# Patient Record
Sex: Male | Born: 1990
Health system: Southern US, Community
[De-identification: ages and names within clinical notes are randomized; demographics above are authoritative.]

## PROBLEM LIST (undated history)

## (undated) DIAGNOSIS — E079 Disorder of thyroid, unspecified: Secondary | ICD-10-CM

## (undated) DIAGNOSIS — Z8619 Personal history of other infectious and parasitic diseases: Secondary | ICD-10-CM

## (undated) HISTORY — DX: Disorder of thyroid, unspecified: E07.9

## (undated) HISTORY — DX: Personal history of other infectious and parasitic diseases: Z86.19

---
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
## 2011-03-08 ENCOUNTER — Encounter: Payer: Self-pay | Admitting: Cardiology

## 2011-03-23 ENCOUNTER — Encounter: Payer: Self-pay | Admitting: Cardiovascular Disease

## 2011-03-24 ENCOUNTER — Encounter: Payer: Self-pay | Admitting: Cardiovascular Disease

## 2011-03-24 ENCOUNTER — Ambulatory Visit (INDEPENDENT_AMBULATORY_CARE_PROVIDER_SITE_OTHER): Payer: PRIVATE HEALTH INSURANCE | Admitting: Cardiovascular Disease

## 2011-03-24 DIAGNOSIS — R079 Chest pain, unspecified: Secondary | ICD-10-CM

## 2011-03-24 DIAGNOSIS — E7211 Homocystinuria: Secondary | ICD-10-CM

## 2011-03-24 DIAGNOSIS — E069 Thyroiditis, unspecified: Secondary | ICD-10-CM

## 2011-03-24 DIAGNOSIS — E7210 Disorders of sulfur-bearing amino-acid metabolism, unspecified: Secondary | ICD-10-CM

## 2011-03-24 DIAGNOSIS — R002 Palpitations: Secondary | ICD-10-CM

## 2011-03-24 NOTE — Assessment & Plan Note (Signed)
Atypical  F/U stress echo   

## 2011-03-24 NOTE — Patient Instructions (Addendum)
Your physician has requested that you have a stress echocardiogram. For further information please visit [email or web address]. Please follow instruction sheet as given.   

## 2011-03-24 NOTE — Assessment & Plan Note (Signed)
Benign no indication for monitor at this time  Normal exam and QT

## 2011-03-24 NOTE — Progress Notes (Signed)
20 yo referred by [facility].  Atypical chest pain and palpitations.  Occurs at rest or with activity.  Some anxiety component.  Sharp pains in chest and shoulders.  Plays lots of softball with no SSCP, dyspnea syncope or palpitations.  Concern about homocystine being high but only one point above ULN and LDL and CRP normal.  Has autoimmune thryroid disease with no other glandular involement.  TSH ok.  No history of previous cardiac problem or murmur.  2 brothers and 1/2 sister ok.  Denies ETOH, drugs or cigaretts.  Symptoms of palpitations and atypical pain persistant but not worsening  ROS: Denies fever, malais, weight loss, blurry vision, decreased visual acuity, cough, sputum, SOB, hemoptysis, pleuritic pain, palpitaitons, heartburn, abdominal pain, melena, lower extremity edema, claudication, or rash.  All other systems reviewed and negative  General: Affect appropriate Healthy:  appears stated age HEENT: normal Neck supple with no adenopathy JVP normal no bruits no thyromegaly Lungs clear with no wheezing and good diaphragmatic motion Heart:  S1/S2 no murmur,rub, gallop or click PMI normal Abdomen: benighn, BS positve, no tenderness, no AAA no bruit.  No HSM or HJR Distal pulses intact with no bruits No edema Neuro non-focal Skin warm and dry No muscular weakness   Current Outpatient Prescriptions  Medication Sig Dispense Refill  . Folic Acid-Vit B6-Vit B12 (FOLBEE) 2.5-25-1 MG TABS Take 1 tablet by mouth daily.        . naltrexone (DEPADE) 50 MG tablet Take 50 mg by mouth daily.        . Nutritional Supplements (DHEA PO) Take 1 tablet by mouth daily.        Marland Kitchen thyroid (ARMOUR) 120 MG tablet Take 120 mg by mouth daily.        . Vitamin D, Ergocalciferol, (DRISDOL) 50000 UNITS CAPS Take 50,000 Units by mouth daily.          Allergies  Review of patient's allergies indicates no known allergies.  Electrocardiogram:  NSR sinus arrhythmia rate 76 Minimal voltage LVH  Nonspecific T wave changes in 3,f  QT 344  Lab Review:  TSH 1.8  Homocystine 16.5 (UN 15) , normal sex hormones, CRP 3 , LDL 76 Free T4 .79  Assessment and Plan

## 2011-03-24 NOTE — Assessment & Plan Note (Signed)
Vit B as needed but other indices like CRP and LDL are fine

## 2011-03-24 NOTE — Assessment & Plan Note (Signed)
F/U primary continue naltrexone.  Thyroid disease may predispose him to sinus arrhythmia and variable HR

## 2011-03-29 ENCOUNTER — Ambulatory Visit (HOSPITAL_COMMUNITY): Payer: PRIVATE HEALTH INSURANCE | Attending: Cardiovascular Disease | Admitting: Radiology

## 2011-03-29 ENCOUNTER — Ambulatory Visit (HOSPITAL_BASED_OUTPATIENT_CLINIC_OR_DEPARTMENT_OTHER): Payer: PRIVATE HEALTH INSURANCE

## 2011-03-29 DIAGNOSIS — R079 Chest pain, unspecified: Secondary | ICD-10-CM | POA: Insufficient documentation

## 2011-03-29 DIAGNOSIS — R002 Palpitations: Secondary | ICD-10-CM | POA: Insufficient documentation

## 2011-03-29 DIAGNOSIS — R0989 Other specified symptoms and signs involving the circulatory and respiratory systems: Secondary | ICD-10-CM

## 2011-03-29 DIAGNOSIS — R072 Precordial pain: Secondary | ICD-10-CM

## 2011-04-06 ENCOUNTER — Encounter: Payer: Self-pay | Admitting: *Deleted

## 2011-04-14 ENCOUNTER — Emergency Department (HOSPITAL_COMMUNITY): Payer: PRIVATE HEALTH INSURANCE

## 2011-04-14 ENCOUNTER — Encounter (HOSPITAL_COMMUNITY): Payer: Self-pay | Admitting: *Deleted

## 2011-04-14 ENCOUNTER — Emergency Department (HOSPITAL_COMMUNITY)
Admission: EM | Admit: 2011-04-14 | Discharge: 2011-04-15 | Disposition: A | Discharge status: Home / Self Care | Payer: PRIVATE HEALTH INSURANCE | Attending: Emergency Medicine | Admitting: Emergency Medicine

## 2011-04-14 DIAGNOSIS — W19XXXA Unspecified fall, initial encounter: Secondary | ICD-10-CM | POA: Insufficient documentation

## 2011-04-14 DIAGNOSIS — Y9364 Activity, baseball: Secondary | ICD-10-CM | POA: Insufficient documentation

## 2011-04-14 DIAGNOSIS — S62109A Fracture of unspecified carpal bone, unspecified wrist, initial encounter for closed fracture: Secondary | ICD-10-CM | POA: Insufficient documentation

## 2011-04-14 DIAGNOSIS — M25539 Pain in unspecified wrist: Secondary | ICD-10-CM | POA: Insufficient documentation

## 2011-04-14 MED ORDER — HYDROCODONE-ACETAMINOPHEN 5-325 MG PO TABS
1.0000 | ORAL_TABLET | Freq: Once | ORAL | Status: AC
  Administered 2011-04-15: 1 via ORAL
  Filled 2011-04-14: qty 1

## 2011-04-14 NOTE — ED Notes (Signed)
Pt reports he was playing softball and slid landing on rt wrist, PMS intact

## 2011-04-14 NOTE — ED Notes (Signed)
Pt states slipped while running & tried to break his fall. Swelling noted.

## 2011-04-15 ENCOUNTER — Encounter (HOSPITAL_COMMUNITY): Payer: Self-pay | Admitting: Emergency Medicine

## 2011-04-15 MED ORDER — HYDROCODONE-ACETAMINOPHEN 5-325 MG PO TABS: ORAL_TABLET | ORAL | Status: AC

## 2011-04-15 NOTE — ED Provider Notes (Signed)
History     CSN: [id number] Arrival date & time: 04/14/2011 11:09 PM  Chief Complaint  Patient presents with  . Wrist Pain    (Consider location/radiation/quality/duration/timing/severity/associated sxs/prior treatment) HPI Comments: Patient c/o pain and swelling to the right wrist after he fell on his wrist while playing softball.  He denies numbness, weakness or other injuries.  Patient is right hand dominant  Patient is a 20 y.o. male presenting with wrist pain. The history is provided by the patient.  Wrist Pain This is a new problem. The current episode started today. The problem occurs constantly. The problem has been unchanged. Associated symptoms include arthralgias and joint swelling. Pertinent negatives include no abdominal pain, chest pain, chills, coughing, fatigue, fever, myalgias, neck pain, numbness, rash, sore throat, vomiting or weakness. Exacerbated by: movement, palpation. He has tried nothing for the symptoms. The treatment provided no relief.    Past Medical History  Diagnosis Date  . Thyroid disease     History reviewed. No pertinent past surgical history.  History reviewed. No pertinent family history.  History  Substance Use Topics  . Smoking status: Never Smoker   . Smokeless tobacco: Not on file  . Alcohol Use: No      Review of Systems  Constitutional: Negative for fever, chills and fatigue.  HENT: Negative for sore throat, trouble swallowing, neck pain and neck stiffness.   Respiratory: Negative for cough, shortness of breath and wheezing.   Cardiovascular: Negative for chest pain and palpitations.  Gastrointestinal: Negative for vomiting and abdominal pain.  Genitourinary: Negative for dysuria, hematuria and flank pain.  Musculoskeletal: Positive for joint swelling and arthralgias. Negative for myalgias and back pain.  Skin: Negative for rash.  Neurological: Negative for dizziness, weakness and numbness.  Hematological: Does not bruise/bleed  easily.  All other systems reviewed and are negative.    Allergies  Review of patient's allergies indicates no known allergies.  Home Medications   Current Outpatient Rx  Name Route Sig Dispense Refill  . FOLIC ACID-VIT B6-VIT B12 2.5-25-1 MG PO TABS Oral Take 1 tablet by mouth daily.      Marland Kitchen NALTREXONE HCL 50 MG PO TABS Oral Take 50 mg by mouth daily.      Marland Kitchen DHEA PO Oral Take 1 tablet by mouth daily.      . THYROID 120 MG PO TABS Oral Take 120 mg by mouth daily.      Marland Kitchen VITAMIN D (ERGOCALCIFEROL) 50000 UNITS PO CAPS Oral Take 50,000 Units by mouth daily.        BP 114/72  Pulse 77  Temp(Src) 98.7 F (37.1 C) (Oral)  Resp 17  SpO2 100%  Physical Exam  Nursing note and vitals reviewed. Constitutional: He is oriented to person, place, and time. He appears well-developed and well-nourished. No distress.  HENT:  Head: Normocephalic and atraumatic.  Mouth/Throat: Oropharynx is clear and moist.  Neck: Normal range of motion. Neck supple.  Cardiovascular: Normal rate, regular rhythm and normal heart sounds.   Pulmonary/Chest: Effort normal and breath sounds normal. No respiratory distress. He exhibits no tenderness.  Abdominal: Soft. He exhibits no distension. There is no tenderness.  Musculoskeletal: He exhibits edema and tenderness.       Right wrist: He exhibits decreased range of motion, tenderness, bony tenderness and swelling. He exhibits no effusion, no crepitus and no laceration.       ttp of the medial wrist and anatomical snuff box.  Moderate STS.  Radial pulse is strong, CR<2 sec,  sensation intact  Lymphadenopathy:    He has no cervical adenopathy.  Neurological: He is alert and oriented to person, place, and time. No cranial nerve deficit. He exhibits normal muscle tone. Coordination normal.  Skin: Skin is warm and dry.    ED Course  ORTHOPEDIC INJURY TREATMENT Date/Time: 04/15/2011 12:24 AM Performed by: Trisha Mangle, Fannie Gathright L. Authorized by: Annamarie Dawley Consent:  Verbal consent obtained. Written consent not obtained. Risks and benefits: risks, benefits and alternatives were discussed Consent given by: patient Patient understanding: patient states understanding of the procedure being performed Patient consent: the patient's understanding of the procedure matches consent given Procedure consent: procedure consent matches procedure scheduled Imaging studies: imaging studies available Patient identity confirmed: verbally with patient Time out: Immediately prior to procedure a "time out" was called to verify the correct patient, procedure, equipment, support staff and site/side marked as required. Injury location: wrist Location details: right wrist Injury type: fracture Fracture type: ulnar styloid and scaphoid Pre-procedure neurovascular assessment: neurovascularly intact Pre-procedure distal perfusion: normal Pre-procedure neurological function: normal Pre-procedure range of motion: reduced Local anesthesia used: no Patient sedated: no Manipulation performed: no Immobilization: splint and sling Splint type: sugar tong Post-procedure neurovascular assessment: post-procedure neurovascularly intact Post-procedure distal perfusion: normal Post-procedure neurological function: normal Post-procedure range of motion: unchanged Patient tolerance: Patient tolerated the procedure well with no immediate complications.   (including critical care time)  Dg Forearm Right  04/14/2011  *RADIOLOGY REPORT*  Clinical Data: Injured right wrist in a softball game, right lateral wrist pain  RIGHT FOREARM - 2 VIEW  Comparison: Right wrist radiographs - earlier same day  Findings: The previously identified minimally displaced fracture of the ulnar styloid process is less conspicuous on the present examination.  No new fractures identified.  Limited visualization of the elbow is normal.  Regional soft tissues are normal.  IMPRESSION: Previously described minimally  displaced fracture of the ulnar styloid process is less conspicuous on this examination.  Please refer to right wrist radiograph dictation performed earlier same day.  Original Report Authenticated By: Waynard Reeds, M.D.   Dg Wrist Complete Right  04/14/2011  *RADIOLOGY REPORT*  Clinical Data: Injured right wrist during softball game; right lateral wrist pain  RIGHT WRIST - COMPLETE 3+ VIEW  Comparison: None.  Findings: There is a minimally displaced fracture of the ulnar styloid process.  Incidental note is made of ulnar minus variance. There is no definitive displacement of the pronator quadratus fat pad.  Radiocarpal and intercarpal articulations are preserved. Regional soft tissues are normal.  IMPRESSION: 1.  Minimally displaced fracture of the ulnar styloid process. 2.  If the patient has pain referable to the anatomic snuff box, immobilization and repeat radiographs in 10-14 days is recommended to exclude radiographically occult scaphoid fracture.  Original Report Authenticated By: Waynard Reeds, M.D.        MDM     patient agrees to follow-up with Dr. Romeo Apple.  Will immobilize his wrist with sugar tong splint.      Reesa Gotschall L. [place], [place] 04/15/11 [phone]

## 2011-04-17 NOTE — ED Provider Notes (Signed)
Medical screening examination/treatment/procedure(s) were performed by non-physician practitioner and as supervising physician I was immediately available for consultation/collaboration.  Nicoletta Dress. Colon Branch, MD 04/17/11 2104

## 2011-11-30 IMAGING — CR DG FOREARM 2V*R*
2 series · 2 of 2 positions shown · non-contrast
Comparison: Right wrist radiographs - earlier same day

CLINICAL DATA: Injured right wrist in a softball game, right
lateral wrist pain

RIGHT FOREARM - 2 VIEW

[view not recorded (1 of 2)]
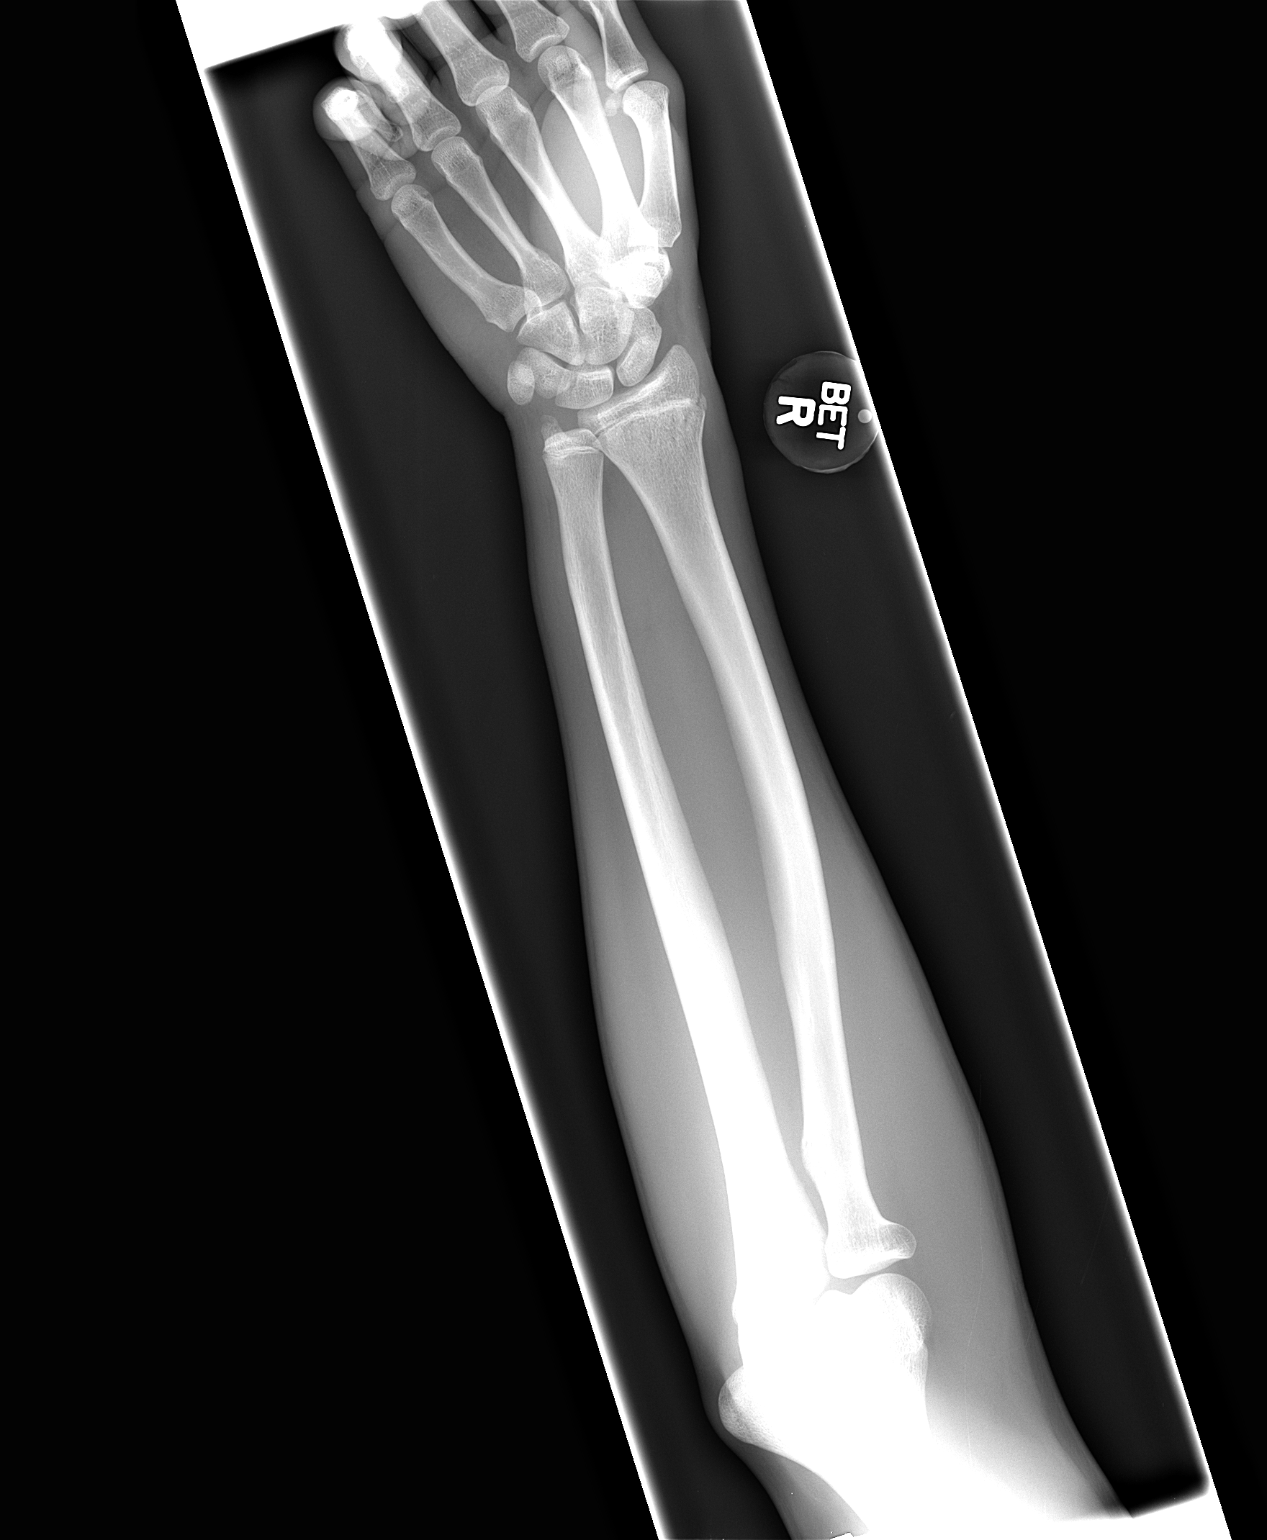

[view not recorded (2 of 2)]
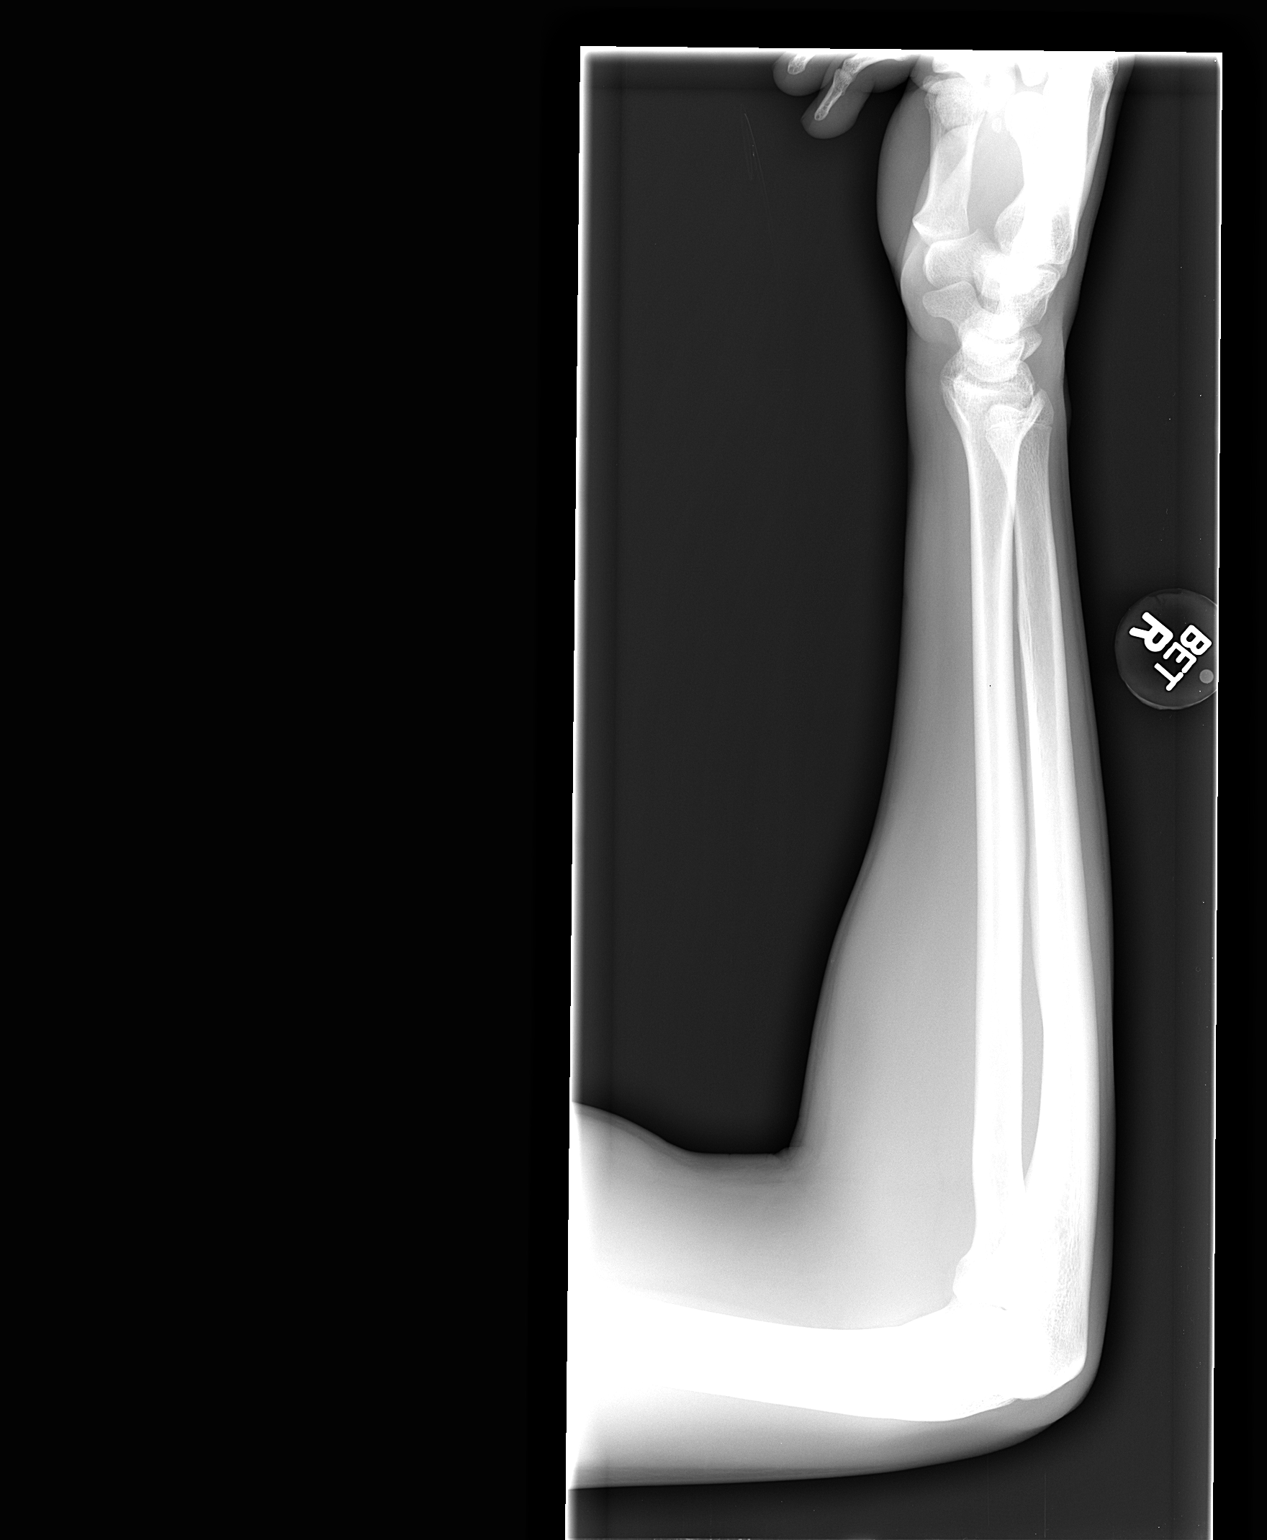

[2 of 2 positions shown; findings below may reference images not displayed]

FINDINGS: The previously identified minimally displaced fracture of
the ulnar styloid process is less conspicuous on the present
examination.  No new fractures identified.  Limited visualization
of the elbow is normal.  Regional soft tissues are normal.
IMPRESSION: Previously described minimally displaced fracture of the ulnar
styloid process is less conspicuous on this examination.  Please
refer to right wrist radiograph dictation performed earlier same
day.

## 2014-03-03 ENCOUNTER — Other Ambulatory Visit: Payer: Self-pay | Admitting: Physician Assistant

## 2014-06-23 ENCOUNTER — Other Ambulatory Visit: Payer: Self-pay | Admitting: Physician Assistant

## 2018-05-30 DIAGNOSIS — E039 Hypothyroidism, unspecified: Secondary | ICD-10-CM | POA: Diagnosis not present

## 2018-05-30 DIAGNOSIS — R002 Palpitations: Secondary | ICD-10-CM | POA: Diagnosis not present

## 2018-08-06 ENCOUNTER — Ambulatory Visit: Payer: PRIVATE HEALTH INSURANCE | Admitting: Neurology

## 2018-08-15 DIAGNOSIS — J310 Chronic rhinitis: Secondary | ICD-10-CM | POA: Diagnosis not present

## 2018-08-15 DIAGNOSIS — R490 Dysphonia: Secondary | ICD-10-CM | POA: Diagnosis not present

## 2019-02-07 ENCOUNTER — Other Ambulatory Visit: Payer: Self-pay

## 2019-02-07 ENCOUNTER — Other Ambulatory Visit: Payer: PRIVATE HEALTH INSURANCE

## 2019-02-07 DIAGNOSIS — Z20822 Contact with and (suspected) exposure to covid-19: Secondary | ICD-10-CM

## 2019-02-10 LAB — NOVEL CORONAVIRUS, NAA: SARS-CoV-2, NAA: NOT DETECTED

## 2019-03-11 DIAGNOSIS — R079 Chest pain, unspecified: Secondary | ICD-10-CM | POA: Diagnosis not present

## 2019-04-18 DIAGNOSIS — E039 Hypothyroidism, unspecified: Secondary | ICD-10-CM | POA: Diagnosis not present

## 2019-05-07 DIAGNOSIS — R042 Hemoptysis: Secondary | ICD-10-CM | POA: Diagnosis not present

## 2019-05-07 DIAGNOSIS — F419 Anxiety disorder, unspecified: Secondary | ICD-10-CM | POA: Diagnosis not present

## 2019-05-07 DIAGNOSIS — R002 Palpitations: Secondary | ICD-10-CM | POA: Diagnosis not present

## 2019-05-31 ENCOUNTER — Other Ambulatory Visit: Payer: Self-pay

## 2019-05-31 DIAGNOSIS — Z20822 Contact with and (suspected) exposure to covid-19: Secondary | ICD-10-CM

## 2019-06-03 LAB — NOVEL CORONAVIRUS, NAA: SARS-CoV-2, NAA: DETECTED — AB

## 2019-08-28 DIAGNOSIS — E039 Hypothyroidism, unspecified: Secondary | ICD-10-CM | POA: Diagnosis not present

## 2019-08-29 LAB — TSH: TSH: 32.8 — AB (ref 0.41–5.90)

## 2019-09-06 DIAGNOSIS — M545 Low back pain: Secondary | ICD-10-CM | POA: Diagnosis not present

## 2019-09-06 DIAGNOSIS — M542 Cervicalgia: Secondary | ICD-10-CM | POA: Diagnosis not present

## 2019-09-06 DIAGNOSIS — M9903 Segmental and somatic dysfunction of lumbar region: Secondary | ICD-10-CM | POA: Diagnosis not present

## 2019-09-06 DIAGNOSIS — M9901 Segmental and somatic dysfunction of cervical region: Secondary | ICD-10-CM | POA: Diagnosis not present

## 2019-09-10 DIAGNOSIS — Z6821 Body mass index (BMI) 21.0-21.9, adult: Secondary | ICD-10-CM | POA: Diagnosis not present

## 2019-09-10 DIAGNOSIS — E039 Hypothyroidism, unspecified: Secondary | ICD-10-CM | POA: Diagnosis not present

## 2019-10-04 ENCOUNTER — Other Ambulatory Visit: Payer: Self-pay

## 2019-10-04 ENCOUNTER — Encounter: Payer: Self-pay | Admitting: "Endocrinology

## 2019-10-04 ENCOUNTER — Ambulatory Visit: Payer: BC Managed Care – PPO | Admitting: "Endocrinology

## 2019-10-04 VITALS — BP 109/75 | HR 71 | Ht 71.0 in | Wt 152.4 lb

## 2019-10-04 DIAGNOSIS — E039 Hypothyroidism, unspecified: Secondary | ICD-10-CM

## 2019-10-04 MED ORDER — SYNTHROID 112 MCG PO TABS: 112.0000 ug | ORAL_TABLET | Freq: Every day | ORAL | 2 refills | Status: DC

## 2019-10-04 NOTE — Progress Notes (Signed)
Endocrinology Consult Note                                         10/04/2019, 1:21 PM   Cameron Cooper is a 29 y.o.-year-old male patient being seen in consultation for hypothyroidism referred by Rosalee Kaufman, PA-C.   Past Medical History:  Diagnosis Date  . Thyroid disease     History reviewed. No pertinent surgical history.  Social History   Socioeconomic History  . Marital status: Married    Spouse name: Not on file  . Number of children: Not on file  . Years of education: Not on file  . Highest education level: Not on file  Occupational History  . Not on file  Tobacco Use  . Smoking status: Never Smoker  Substance and Sexual Activity  . Alcohol use: No  . Drug use: Not on file  . Sexual activity: Not on file  Other Topics Concern  . Not on file  Social History Narrative  . Not on file   Social Determinants of Health   Financial Resource Strain:   . Difficulty of Paying Living Expenses:   Food Insecurity:   . Worried About Charity fundraiser in the Last Year:   . Arboriculturist in the Last Year:   Transportation Needs:   . Film/video editor (Medical):   Marland Kitchen Lack of Transportation (Non-Medical):   Physical Activity:   . Days of Exercise per Week:   . Minutes of Exercise per Session:   Stress:   . Feeling of Stress :   Social Connections:   . Frequency of Communication with Friends and Family:   . Frequency of Social Gatherings with Friends and Family:   . Attends Religious Services:   . Active Member of Clubs or Organizations:   . Attends Archivist Meetings:   Marland Kitchen Marital Status:     Family History  Problem Relation Age of Onset  . Thyroid disease Mother     Outpatient Encounter Medications as of 10/04/2019  Medication Sig  . NALTREXONE HCL PO Take 4.5 mg by mouth at bedtime.  . Nutritional Supplements (DHEA PO) Take 1 tablet by mouth daily.    Marland Kitchen  SYNTHROID 112 MCG tablet Take 1 tablet (112 mcg total) by mouth daily before breakfast.  . [DISCONTINUED] Folic Acid-Vit M4-QAS T41 (FOLBEE) 2.5-25-1 MG TABS Take 1 tablet by mouth daily.    . [DISCONTINUED] naltrexone (DEPADE) 50 MG tablet Take 4.5 mg by mouth daily.   . [DISCONTINUED] SYNTHROID 100 MCG tablet Take 100 mcg by mouth every morning.  . [DISCONTINUED] thyroid (ARMOUR) 120 MG tablet Take 120 mg by mouth daily.    . [DISCONTINUED] Vitamin D, Ergocalciferol, (DRISDOL) 50000 UNITS CAPS Take 50,000 Units by mouth daily.     No facility-administered encounter medications on file as of 10/04/2019.    ALLERGIES: No Known Allergies VACCINATION STATUS:  There is no immunization  history on file for this patient.   HPI    Cameron Cooper  is a patient with the above medical history. he was diagnosed  with hypothyroidism at approximate age of 16 years  which required initiation of thyroid hormone supplements.  - he was given various doses of thyroxine/Armour thyroid over the years, currently on 100 mcg of Synthroid. he reports compliance to this medication: His most recent thyroid function test in February 2021 was significant for high TSH of 32. He felt fatigue, cold intolerance.   Lab Results  Component Value Date   TSH 32.80 (A) 08/29/2019  He has progressively gained 12 pound x2 years.  Pt denies feeling nodules in neck, hoarseness, dysphagia/odynophagia, SOB with lying down.  he has family history of  thyroid disorders in his mother and grandparents.  No family history of thyroid cancer.  No history of  radiation therapy to head or neck. No recent use of iodine supplements.  ROS:  Constitutional: + weight gain, + fatigue, no subjective hyperthermia, no subjective hypothermia Eyes: no blurry vision, no xerophthalmia ENT: no sore throat, no nodules palpated in throat, no dysphagia/odynophagia, no hoarseness Cardiovascular: no Chest Pain, no Shortness of Breath, no  palpitations, no leg swelling Respiratory: no cough, no SOB Gastrointestinal: no Nausea/Vomiting/Diarhhea Musculoskeletal: no muscle/joint aches Skin: no rashes Neurological: no tremors, no numbness, no tingling, no dizziness Psychiatric: no depression, no anxiety   Physical Exam: BP 109/75   Pulse 71   Ht 5\' 11"  (1.803 m)   Wt 152 lb 6.4 oz (69.1 kg)   BMI 21.26 kg/m  Wt Readings from Last 3 Encounters:  10/04/19 152 lb 6.4 oz (69.1 kg)  03/24/11 140 lb (63.5 kg) (28 %, Z= -0.60)*   * Growth percentiles are based on CDC (Boys, 2-20 Years) data.    Constitutional:  Body mass index is 21.26 kg/m., not in acute distress, normal state of mind Eyes: PERRLA, EOMI, no exophthalmos ENT: moist mucous membranes, no thyromegaly, no cervical lymphadenopathy Cardiovascular: normal precordial activity, Regular Rate and Rhythm, no Murmur/Rubs/Gallops Respiratory:  adequate breathing efforts, no gross chest deformity, Clear to auscultation bilaterally Gastrointestinal: abdomen soft, Non -tender, No distension, Bowel Sounds present Musculoskeletal: no gross deformities, strength intact in all four extremities Skin: moist, warm, no rashes Neurological: no tremor with outstretched hands, Deep tendon reflexes normal in all four extremities.    Lab Results  Component Value Date   TSH 32.80 (A) 08/29/2019       ASSESSMENT: 1. Hypothyroidism  PLAN:    Patient with long-standing hypothyroidism, on levothyroxine therapy. On physical exam , patient  does not  have  gross goiter, thyroid nodules, or neck compression symptoms.  He will benefit from slight increase in his Synthroid dose.  I discussed and increased it to 112 mcg p.o. daily before breakfast. - We discussed about correct intake of levothyroxine, at fasting, with water, separated by at least 30 minutes from breakfast, and separated by more than 4 hours from calcium, iron, multivitamins, acid reflux medications (PPIs). -Patient is  made aware of the fact that thyroid hormone replacement is needed for life, dose to be adjusted by periodic monitoring of thyroid function tests. - Will check thyroid tests before next visit: TSH, free T4 -Due to absence of clinical goiter, no need for thyroid ultrasound.  - Time spent with the patient: 45 minutes, of which >50% was spent in obtaining information about his symptoms, reviewing his previous labs, evaluations, and treatments, counseling him about his primary  hypothyroidism, and developing a plan to confirm the diagnosis and long term treatment as necessary. Please refer to " Patient Self Inventory" in the Media  tab for reviewed elements of pertinent patient history.  Jesson L Belzer participated in the discussions, expressed understanding, and voiced agreement with the above plans.  All questions were answered to his satisfaction. he is encouraged to contact clinic should he have any questions or concerns prior to his return visit.  Return in about 9 weeks (around 12/06/2019) for Follow up with Pre-visit Labs.  Marquis Lunch, MD [facility] [address] [place], [place] [address] Phone: [phone]  Fax: [phone]   10/04/2019, 1:21 PM  This note was partially dictated with voice recognition software. Similar sounding words can be transcribed inadequately or may not  be corrected upon review.

## 2019-11-25 DIAGNOSIS — E063 Autoimmune thyroiditis: Secondary | ICD-10-CM | POA: Diagnosis not present

## 2019-11-25 DIAGNOSIS — E039 Hypothyroidism, unspecified: Secondary | ICD-10-CM | POA: Diagnosis not present

## 2019-11-25 DIAGNOSIS — Z0189 Encounter for other specified special examinations: Secondary | ICD-10-CM | POA: Diagnosis not present

## 2019-12-04 ENCOUNTER — Ambulatory Visit: Payer: BC Managed Care – PPO | Admitting: "Endocrinology

## 2019-12-10 ENCOUNTER — Other Ambulatory Visit: Payer: Self-pay | Admitting: "Endocrinology

## 2019-12-20 DIAGNOSIS — Z0189 Encounter for other specified special examinations: Secondary | ICD-10-CM | POA: Diagnosis not present

## 2019-12-20 DIAGNOSIS — E039 Hypothyroidism, unspecified: Secondary | ICD-10-CM | POA: Diagnosis not present

## 2019-12-20 LAB — LIPID PANEL
Cholesterol: 144 (ref 0–200)
HDL: 48 (ref 35–70)
LDL Cholesterol: 83
Triglycerides: 63 (ref 40–160)

## 2019-12-20 LAB — BASIC METABOLIC PANEL
BUN: 10 (ref 4–21)
Creatinine: 0.8 (ref 0.6–1.3)

## 2019-12-20 LAB — TSH: TSH: 0.62 (ref 0.41–5.90)

## 2019-12-27 DIAGNOSIS — Z0001 Encounter for general adult medical examination with abnormal findings: Secondary | ICD-10-CM | POA: Diagnosis not present

## 2019-12-30 ENCOUNTER — Encounter: Payer: Self-pay | Admitting: "Endocrinology

## 2019-12-30 ENCOUNTER — Other Ambulatory Visit: Payer: Self-pay

## 2019-12-30 ENCOUNTER — Ambulatory Visit (INDEPENDENT_AMBULATORY_CARE_PROVIDER_SITE_OTHER): Payer: BC Managed Care – PPO | Admitting: "Endocrinology

## 2019-12-30 VITALS — BP 111/65 | HR 63 | Ht 71.0 in | Wt 151.9 lb

## 2019-12-30 DIAGNOSIS — E039 Hypothyroidism, unspecified: Secondary | ICD-10-CM

## 2019-12-30 MED ORDER — LEVOTHYROXINE SODIUM 112 MCG PO TABS: ORAL_TABLET | ORAL | 2 refills | Status: DC

## 2019-12-30 NOTE — Progress Notes (Signed)
12/30/2019, 6:45 PM  Endocrinology follow-up note   Cameron Cooper is a 29 y.o.-year-old male patient being seen in follow-up after he was seen in consultation for hypothyroidism referred by Royann Shivers, PA-C.   Past Medical History:  Diagnosis Date  . Thyroid disease     History reviewed. No pertinent surgical history.  Social History   Socioeconomic History  . Marital status: Married    Spouse name: Not on file  . Number of children: Not on file  . Years of education: Not on file  . Highest education level: Not on file  Occupational History  . Not on file  Tobacco Use  . Smoking status: Never Smoker  Substance and Sexual Activity  . Alcohol use: No  . Drug use: Not on file  . Sexual activity: Not on file  Other Topics Concern  . Not on file  Social History Narrative  . Not on file   Social Determinants of Health   Financial Resource Strain:   . Difficulty of Paying Living Expenses:   Food Insecurity:   . Worried About Programme researcher, broadcasting/film/video in the Last Year:   . Barista in the Last Year:   Transportation Needs:   . Freight forwarder (Medical):   Marland Kitchen Lack of Transportation (Non-Medical):   Physical Activity:   . Days of Exercise per Week:   . Minutes of Exercise per Session:   Stress:   . Feeling of Stress :   Social Connections:   . Frequency of Communication with Friends and Family:   . Frequency of Social Gatherings with Friends and Family:   . Attends Religious Services:   . Active Member of Clubs or Organizations:   . Attends Banker Meetings:   Marland Kitchen Marital Status:     Family History  Problem Relation Age of Onset  . Thyroid disease Mother     Outpatient Encounter Medications as of 12/30/2019  Medication Sig  . levothyroxine (SYNTHROID) 112 MCG tablet TAKE (1) TABLET BY MOUTH EACH MORNING.  Marland Kitchen NALTREXONE HCL PO Take 4.5 mg by mouth at bedtime.   . Nutritional Supplements (DHEA PO) Take 1 tablet by mouth daily.    . [DISCONTINUED] levothyroxine (SYNTHROID) 112 MCG tablet TAKE (1) TABLET BY MOUTH EACH MORNING.   No facility-administered encounter medications on file as of 12/30/2019.    ALLERGIES: No Known Allergies VACCINATION STATUS:  There is no immunization history on file for this patient.   HPI    Cameron Cooper  is a patient with the above medical history. he was diagnosed  with hypothyroidism at approximate age of 16 years  which required initiation of thyroid hormone supplements.  - he was given various doses of thyroxine/Armour thyroid over the years, currently on of Synthroid. he reports compliance to this medication: His most recent thyroid function tests were consistent with appropriate replacement.   He has a steady weight, patient  denies feeling nodules in neck, hoarseness, dysphagia/odynophagia, SOB with lying down.  he has family history of  thyroid  disorders in his mother and grandparents.  No family history of thyroid cancer.  No history of  radiation therapy to head or neck. No recent use of iodine supplements.  ROS:  Constitutional: + Steady weight since last visit,  no subjective hyperthermia, no subjective hypothermia Eyes: no blurry vision, no xerophthalmia ENT: no sore throat, no nodules palpated in throat, no dysphagia/odynophagia, no hoarseness Cardiovascular: no Chest Pain, no Shortness of Breath, no palpitations, no leg swelling Respiratory: no cough, no SOB Gastrointestinal: no Nausea/Vomiting/Diarhhea Musculoskeletal: no muscle/joint aches Skin: no rashes Neurological: no tremors, no numbness, no tingling, no dizziness Psychiatric: no depression, no anxiety   Physical Exam: BP 111/65   Pulse 63   Ht 5\' 11"  (1.803 m)   Wt 151 lb 14.4 oz (68.9 kg)   BMI 21.19 kg/m  Wt Readings from Last 3 Encounters:  12/30/19 151 lb 14.4 oz (68.9 kg)  10/04/19 152 lb 6.4 oz (69.1 kg)   03/24/11 140 lb (63.5 kg) (28 %, Z= -0.60)*   * Growth percentiles are based on CDC (Boys, 2-20 Years) data.    Recent Results (from the past 2160 hour(s))  Basic metabolic panel     Status: None   Collection Time: 12/20/19 12:00 AM  Result Value Ref Range   BUN 10 4 - 21   Creatinine 0.8 0.6 - 1.3  Lipid panel     Status: None   Collection Time: 12/20/19 12:00 AM  Result Value Ref Range   Triglycerides 63 40 - 160   Cholesterol 144 0 - 200   HDL 48 35 - 70   LDL Cholesterol 83   TSH     Status: None   Collection Time: 12/20/19 12:00 AM  Result Value Ref Range   TSH 0.62 0.41 - 5.90    Comment: free t4 1.5     ASSESSMENT: 1. Hypothyroidism  PLAN:  -His previsit thyroid function tests are consistent with appropriate replacement.  -He is advised to continue levothyroxine 112 mcg p.o. daily before breakfast.   - We discussed about the correct intake of his thyroid hormone, on empty stomach at fasting, with water, separated by at least 30 minutes from breakfast and other medications,  and separated by more than 4 hours from calcium, iron, multivitamins, acid reflux medications (PPIs). -Patient is made aware of the fact that thyroid hormone replacement is needed for life, dose to be adjusted by periodic monitoring of thyroid function tests. He does not have clinical goiter, hence no need for thyroid imaging at this time.  His next labs will include antithyroid antibodies to establish etiology of his hypothyroidism.  He is advised to maintain close follow-up with his PCP Clemmie Krill, PA-C.     - Time spent on this patient care encounter:  20 minutes of which 50% was spent in  counseling and the rest reviewing  his current and  previous labs / studies and medications  doses and developing a plan for long term care. Cordie L Strupp  participated in the discussions, expressed understanding, and voiced agreement with the above plans.  All questions were answered to his  satisfaction. he is encouraged to contact clinic should he have any questions or concerns prior to his return visit.   Return in about 5 months (around 05/31/2020) for F/U with Pre-visit Labs.  Glade Lloyd, MD [facility] [address] [place], [place] [address] Phone: [phone]  Fax: [phone]   12/30/2019, 6:45 PM  This note  was partially dictated with voice recognition software. Similar sounding words can be transcribed inadequately or may not  be corrected upon review.

## 2020-03-05 DIAGNOSIS — F419 Anxiety disorder, unspecified: Secondary | ICD-10-CM | POA: Diagnosis not present

## 2020-03-05 DIAGNOSIS — R42 Dizziness and giddiness: Secondary | ICD-10-CM | POA: Diagnosis not present

## 2020-03-05 DIAGNOSIS — R002 Palpitations: Secondary | ICD-10-CM | POA: Diagnosis not present

## 2020-03-09 ENCOUNTER — Other Ambulatory Visit: Payer: Self-pay | Admitting: "Endocrinology

## 2020-03-19 DIAGNOSIS — E063 Autoimmune thyroiditis: Secondary | ICD-10-CM | POA: Diagnosis not present

## 2020-03-19 DIAGNOSIS — R5382 Chronic fatigue, unspecified: Secondary | ICD-10-CM | POA: Diagnosis not present

## 2020-03-19 DIAGNOSIS — Z0189 Encounter for other specified special examinations: Secondary | ICD-10-CM | POA: Diagnosis not present

## 2020-03-19 DIAGNOSIS — E039 Hypothyroidism, unspecified: Secondary | ICD-10-CM | POA: Diagnosis not present

## 2020-03-31 DIAGNOSIS — H4710 Unspecified papilledema: Secondary | ICD-10-CM | POA: Diagnosis not present

## 2020-04-23 ENCOUNTER — Ambulatory Visit: Payer: BC Managed Care – PPO | Admitting: Neurology

## 2020-04-23 ENCOUNTER — Encounter: Payer: Self-pay | Admitting: Neurology

## 2020-04-23 DIAGNOSIS — H4710 Unspecified papilledema: Secondary | ICD-10-CM | POA: Diagnosis not present

## 2020-04-23 DIAGNOSIS — G4489 Other headache syndrome: Secondary | ICD-10-CM

## 2020-04-23 NOTE — Progress Notes (Signed)
Reason for visit: Papilledema  Referring physician: Dr. Oneita Hurt Howse is a 29 y.o. male  History of present illness:  Mr. Mcclintock is a 29 year old right-handed white male with a history of some slight visual blurring and difficulty with focusing with vision, and light sensitivity that has been present for about a year.  The patient went for an ophthalmologic evaluation and was found to have bilateral papilledema.  The patient reports occasional events of headaches that have occurred over the last 3 years where he will have some dull pain behind the eyes and some black spots in front of the eyes that may last about 30 minutes.  The patient will lay down, the whole event lasted about 3 hours and then will resolve.  The patient reports no focal numbness or weakness of the face, arms, or legs.  He denies issues controlling the bowels or the bladder.  He reports some minimal changes in balance, occasional episodes of vertigo.  The patient denies any double vision or loss of vision.  He is sent to this office for further evaluation given the findings on the ophthalmologic evaluation.  He denies any significant neck stiffness.  Past Medical History:  Diagnosis Date  . H/O [facility] spotted fever   . Thyroid disease     History reviewed. No pertinent surgical history.  Family History  Problem Relation Age of Onset  . Thyroid disease Mother     Social history:  reports that he has never smoked. He does not have any smokeless tobacco history on file. He reports that he does not drink alcohol and does not use drugs.  Medications:  Prior to Admission medications   Medication Sig Start Date End Date Taking? Authorizing Provider  levothyroxine (SYNTHROID) 112 MCG tablet TAKE (1) TABLET BY MOUTH EACH MORNING. 03/10/20   Roma Kayser, MD  NALTREXONE HCL PO Take 4.5 mg by mouth at bedtime.    [provider]  Nutritional Supplements (DHEA PO) Take 1 tablet by mouth daily.       [provider]     No Known Allergies  ROS:  Out of a complete 14 system review of symptoms, the patient complains only of the following symptoms, and all other reviewed systems are negative.  Visual blurring Headache  Blood pressure 126/76, pulse 73, height 5\' 11"  (1.803 m), weight 150 lb 8 oz (68.3 kg).  Physical Exam  General: The patient is alert and cooperative at the time of the examination.  Eyes: Pupils are equal, round, and reactive to light. Discs are flat bilaterally, with exception of some mild blurring of the medial margins of the discs.  No venous pulsations are seen.  Neck: The neck is supple, no carotid bruits are noted.  Respiratory: The respiratory examination is clear.  Cardiovascular: The cardiovascular examination reveals a regular rate and rhythm, no obvious murmurs or rubs are noted.  Skin: Extremities are without significant edema.  Neurologic Exam  Mental status: The patient is alert and oriented x 3 at the time of the examination. The patient has apparent normal recent and remote memory, with an apparently normal attention span and concentration ability.  Cranial nerves: Facial symmetry is present. There is good sensation of the face to pinprick and soft touch bilaterally. The strength of the facial muscles and the muscles to head turning and shoulder shrug are normal bilaterally. Speech is well enunciated, no aphasia or dysarthria is noted. Extraocular movements are full. Visual fields are  full. The tongue is midline, and the patient has symmetric elevation of the soft palate. No obvious hearing deficits are noted.  Motor: The motor testing reveals 5 over 5 strength of all 4 extremities. Good symmetric motor tone is noted throughout.  Sensory: Sensory testing is intact to pinprick, soft touch, vibration sensation, and position sense on all 4 extremities. No evidence of extinction is noted.  Coordination: Cerebellar testing reveals good  finger-nose-finger and heel-to-shin bilaterally.  Gait and station: Gait is normal. Tandem gait is normal. Romberg is negative. No drift is seen.  Reflexes: Deep tendon reflexes are symmetric and normal bilaterally. Toes are downgoing bilaterally.   Assessment/Plan:  1.  Bilateral papilledema  The patient is a thin white male, typically would be at low risk for something like pseudotumor cerebri.  In the records, the diagnosis lists homocystinemia, the patient really does not know anything about this diagnosis.  The patient be set up for MRI of the brain with and without gadolinium, MRV of the head.  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  If the MRI is unremarkable, he will be set up for lumbar puncture.  If no elevation in spinal fluid pressure is noted, he will be referred to an ophthalmologist for evaluation of possible causes of pseudopapilledema.  Marlan Palau MD 04/23/2020 10:49 AM  [facility] [address] [place], [place] [address]  Phone [phone] Fax [phone]

## 2020-04-27 ENCOUNTER — Telehealth: Payer: Self-pay | Admitting: Neurology

## 2020-04-27 DIAGNOSIS — G4489 Other headache syndrome: Secondary | ICD-10-CM

## 2020-04-27 NOTE — Telephone Encounter (Signed)
When you get a chance can you put a new order in for a MRV Head wo contrast. Thank you!

## 2020-04-30 NOTE — Telephone Encounter (Signed)
Cameron Cooper Auth: [id number] (exp. 04/30/20 to 05/29/20) order sent to GI. They will reach out to the patient to schedule.

## 2020-05-05 ENCOUNTER — Ambulatory Visit: Payer: PRIVATE HEALTH INSURANCE | Admitting: Cardiology

## 2020-05-06 ENCOUNTER — Other Ambulatory Visit: Payer: Self-pay | Admitting: Neurology

## 2020-05-06 DIAGNOSIS — H4710 Unspecified papilledema: Secondary | ICD-10-CM

## 2020-05-06 DIAGNOSIS — G4489 Other headache syndrome: Secondary | ICD-10-CM

## 2020-05-07 ENCOUNTER — Ambulatory Visit
Admission: RE | Admit: 2020-05-07 | Discharge: 2020-05-07 | Disposition: A | Discharge status: Home / Self Care | Payer: PRIVATE HEALTH INSURANCE | Source: Ambulatory Visit | Attending: Neurology | Admitting: Neurology

## 2020-05-07 ENCOUNTER — Other Ambulatory Visit: Payer: Self-pay

## 2020-05-07 ENCOUNTER — Ambulatory Visit
Admission: RE | Admit: 2020-05-07 | Discharge: 2020-05-07 | Disposition: A | Discharge status: Home / Self Care | Payer: BC Managed Care – PPO | Source: Ambulatory Visit | Attending: Neurology | Admitting: Neurology

## 2020-05-07 DIAGNOSIS — G4489 Other headache syndrome: Secondary | ICD-10-CM

## 2020-05-07 DIAGNOSIS — H4710 Unspecified papilledema: Secondary | ICD-10-CM | POA: Diagnosis not present

## 2020-05-07 DIAGNOSIS — Z01818 Encounter for other preprocedural examination: Secondary | ICD-10-CM | POA: Diagnosis not present

## 2020-05-07 MED ORDER — GADOBENATE DIMEGLUMINE 529 MG/ML IV SOLN
14.0000 mL | Freq: Once | INTRAVENOUS | Status: AC | PRN
  Administered 2020-05-07: 14 mL via INTRAVENOUS

## 2020-05-08 ENCOUNTER — Telehealth: Payer: Self-pay | Admitting: Neurology

## 2020-05-08 DIAGNOSIS — H4710 Unspecified papilledema: Secondary | ICD-10-CM

## 2020-05-08 NOTE — Telephone Encounter (Signed)
I called the patient.  MRI of the brain is relatively normal, MRV shows some narrowing of the transverse and sigmoid sinuses consistent with elevated intracranial pressure, no thrombosis is seen.  Not clear why the patient would have elevated intracranial pressure, but will go on to get lumbar puncture, discussed with patient.   MRI brain 05/07/20:  IMPRESSION: This MRI of the brain with and without contrast shows the following: 1.   Brain parenchyma appears normal before and after contrast. 2.   The pituitary gland has a reduced height and a normal sized sella turcica.  This is usually an incidental finding but could also be seen with elevated intracranial pressures 3.   Normal enhancement pattern and no acute findings.   MRV Head 05/07/20:  IMPRESSION: This MR venogram of the intracranial veins and dural sinuses shows the following: 1.   There is reduced flow bilaterally at the junction of the transverse and sigmoid sinuses.  This could be an incidental finding but is more commonly seen with elevated intracranial pressure. 2.   Other veins and dural sinuses appear normal.

## 2020-05-18 ENCOUNTER — Telehealth: Payer: Self-pay | Admitting: Neurology

## 2020-05-18 ENCOUNTER — Ambulatory Visit
Admission: RE | Admit: 2020-05-18 | Discharge: 2020-05-18 | Disposition: A | Discharge status: Home / Self Care | Payer: BC Managed Care – PPO | Source: Ambulatory Visit | Attending: Neurology | Admitting: Neurology

## 2020-05-18 VITALS — BP 108/65 | HR 56

## 2020-05-18 DIAGNOSIS — H4710 Unspecified papilledema: Secondary | ICD-10-CM

## 2020-05-18 LAB — CSF CELL COUNT WITH DIFFERENTIAL
RBC Count, CSF: 2 cells/uL — ABNORMAL HIGH
WBC, CSF: 0 cells/uL (ref 0–5)

## 2020-05-18 LAB — GLUCOSE, CSF: Glucose, CSF: 61 mg/dL (ref 40–80)

## 2020-05-18 LAB — PROTEIN, CSF: Total Protein, CSF: 26 mg/dL (ref 15–45)

## 2020-05-18 MED ORDER — ACETAZOLAMIDE 125 MG PO TABS: 125.0000 mg | ORAL_TABLET | Freq: Two times a day (BID) | ORAL | 3 refills | Status: DC

## 2020-05-18 NOTE — Discharge Instructions (Signed)

## 2020-05-18 NOTE — Telephone Encounter (Signed)
I called the patient.  The lumbar puncture showed a borderline abnormal opening pressure of 25 which is adequate to make the diagnosis of pseudotumor.  Spinal fluid is unremarkable, I will start on a very low-dose of Diamox. He is to wait least 3 days after the spinal tap to start the medication to ensure that he does not have a spinal headache.

## 2020-06-01 ENCOUNTER — Ambulatory Visit: Payer: BC Managed Care – PPO | Admitting: "Endocrinology

## 2020-06-11 ENCOUNTER — Ambulatory Visit: Payer: PRIVATE HEALTH INSURANCE | Admitting: Neurology

## 2020-07-15 ENCOUNTER — Telehealth: Payer: Self-pay | Admitting: Neurology

## 2020-07-15 MED ORDER — POTASSIUM CHLORIDE ER 10 MEQ PO TBCR: 10.0000 meq | EXTENDED_RELEASE_TABLET | Freq: Every day | ORAL | 3 refills | Status: AC

## 2020-07-15 MED ORDER — FUROSEMIDE 20 MG PO TABS: 20.0000 mg | ORAL_TABLET | Freq: Every day | ORAL | 3 refills | Status: AC

## 2020-07-15 NOTE — Telephone Encounter (Signed)
Pt called, having side effect from acetaZOLAMIDE (DIAMOX) 125 MG tablet; tingling in hands and feet. Can we try something else or do you have any suggestion. Would like a call from the nurse.

## 2020-07-15 NOTE — Addendum Note (Signed)
Addended by: York Spaniel on: 07/15/2020 02:10 PM   Modules accepted: Orders

## 2020-07-15 NOTE — Telephone Encounter (Signed)
Patient stated that he noticed since the first time he took it a couple months ago, he started having numbness/tingling in hands and feet.  He thought it would go away eventually but still remains.  He quit taking it about a week ago, not having any symptoms. But is wanting to know if there is something else he could take in place of the diamox?  Thank you

## 2020-07-15 NOTE — Telephone Encounter (Signed)
I called the patient.  The patient was having some tingling in the hands and feet following the Diamox, he did not believe that this was helping his visual complaints with spots in front of the eyes.  He has gone off the Diamox.  I will switch him to Lasix taking 20 mg a day with potassium.  He reports some intermittent ringing in the ears, some popping in the ears, he may have some difficulty equalizing pressure through the eustachian tubes.

## 2020-12-02 ENCOUNTER — Telehealth: Payer: Self-pay | Admitting: Neurology

## 2020-12-02 MED ORDER — TOPIRAMATE 25 MG PO TABS: ORAL_TABLET | ORAL | 3 refills | Status: AC

## 2020-12-02 NOTE — Telephone Encounter (Signed)
Pt called wanting to speak to the Provider regarding poss intercranial pressure that he may be having that are causing the symptoms he is experiencing. Please advise.

## 2020-12-02 NOTE — Telephone Encounter (Signed)
I called the patient.  The patient is still having some symptoms of pseudotumor, is having some light sensitivity and some auditory issues.  We will try Topamax working up to 25 mg twice daily, he tolerates this we may be able to increase the dose.  He did not tolerate acetazolamide due to tingling problems.  In the future, we may be able to get him off of the Lasix.  He is to follow-up with his eye doctor to look for any signs of improvement or worsening.  He was questioning whether or not his Synthroid was the cause of his pseudotumor, I indicated this is likely not the case.

## 2020-12-23 IMAGING — CR DG ORBITS FOR FOREIGN BODY
2 series · 2 of 2 positions shown · non-contrast
Comparison: None.

CLINICAL DATA: Metal working/exposure; clearance prior to MRI

EXAM:
ORBITS FOR FOREIGN BODY - 2 VIEW

[w orbit pa (1 of 2)]
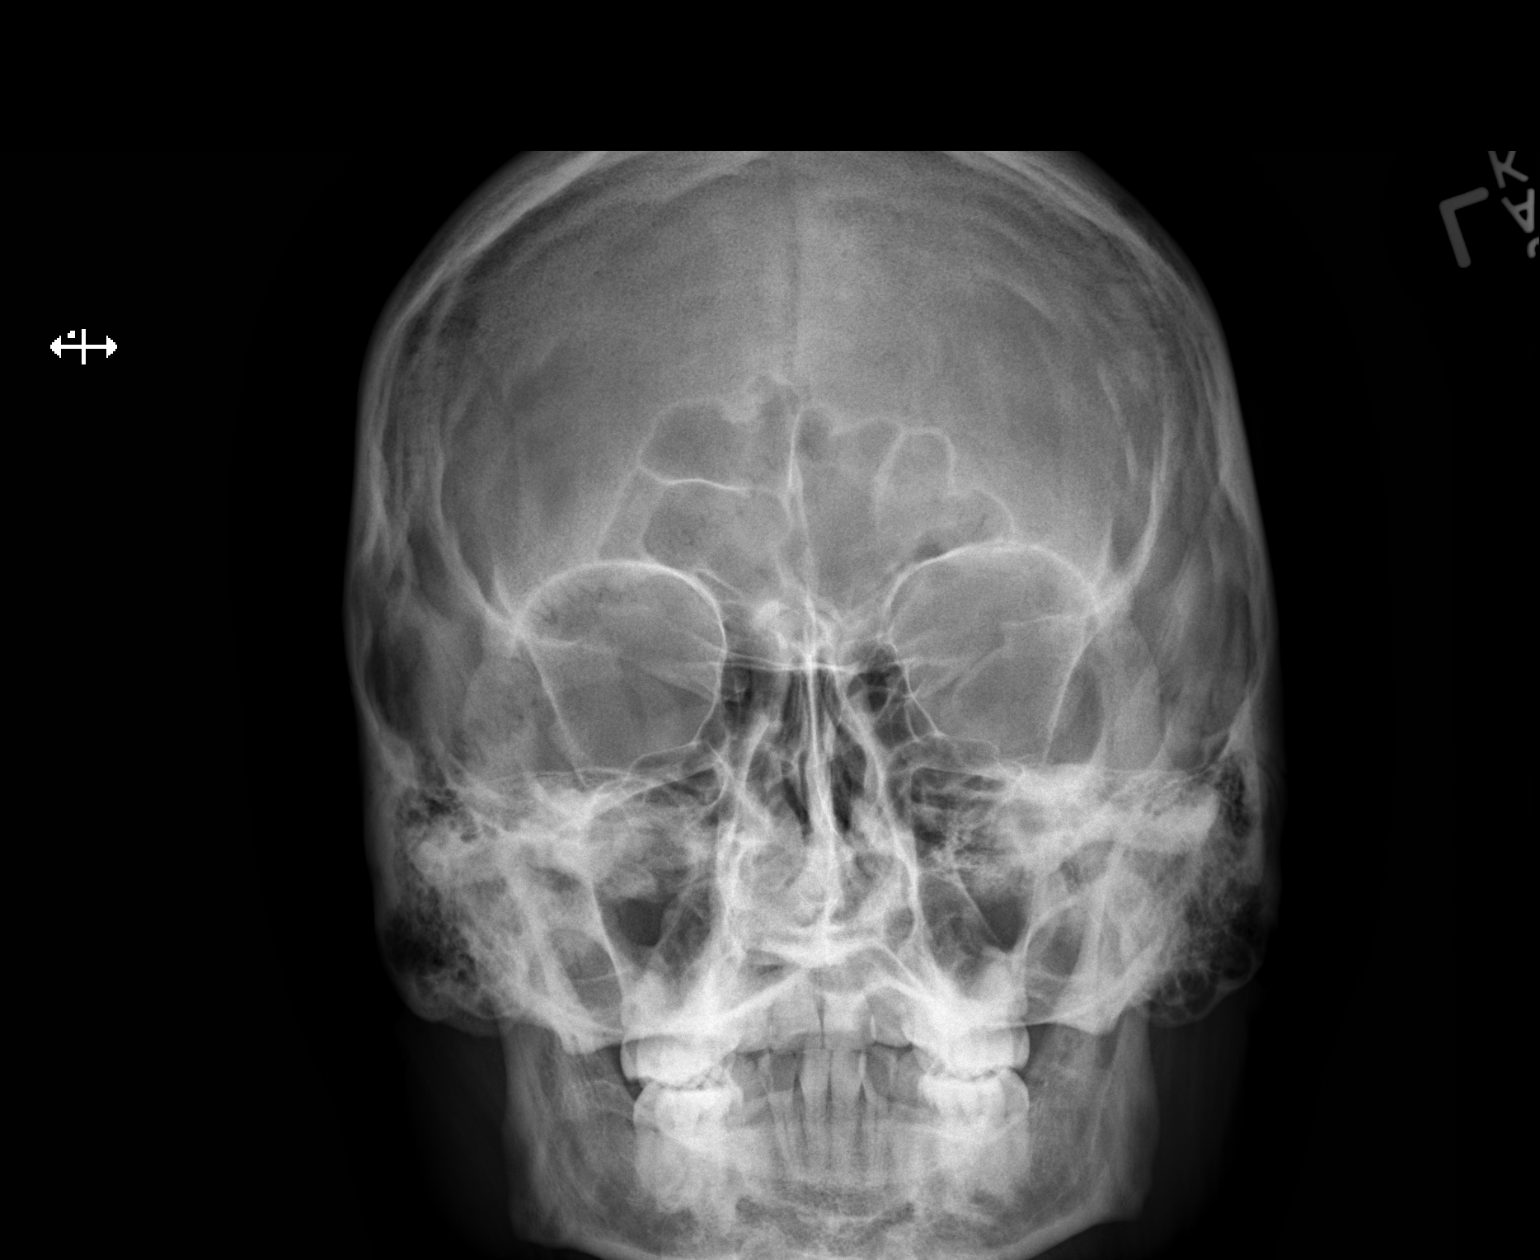

[w orbit pa (2 of 2)]
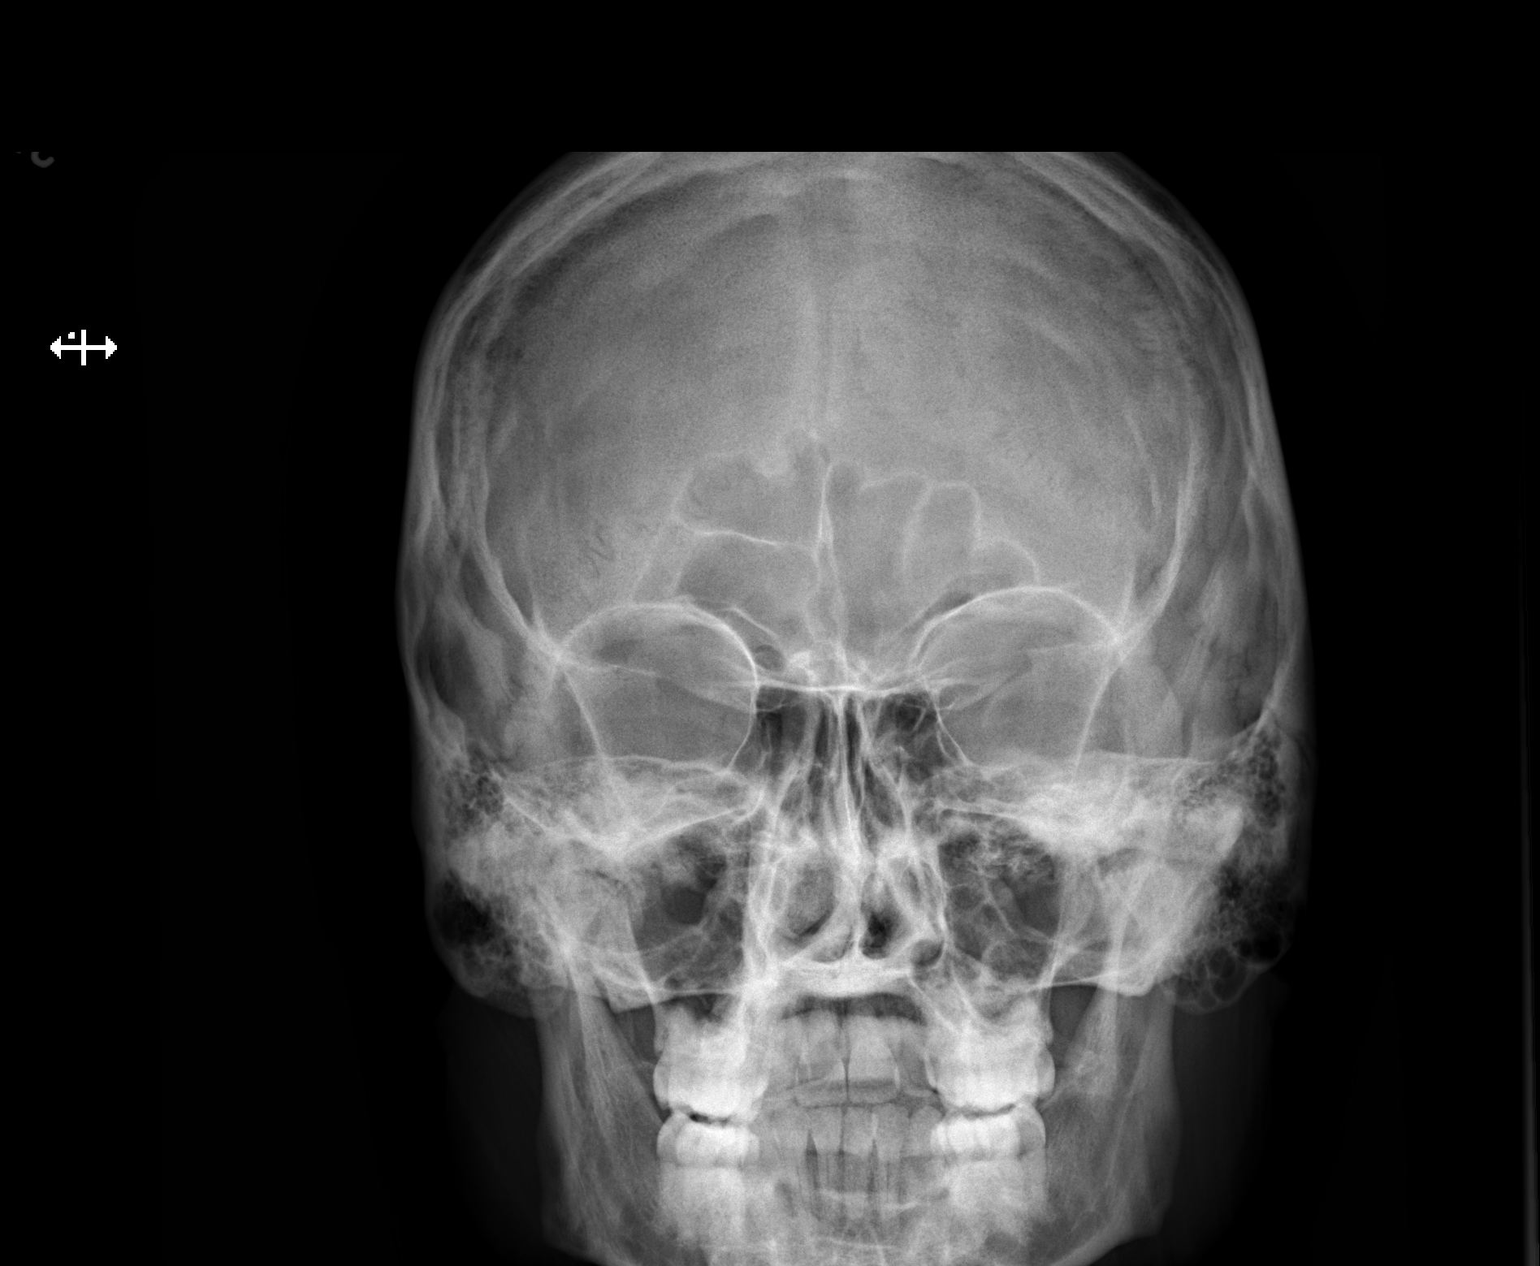

[2 of 2 positions shown; findings below may reference images not displayed]

FINDINGS: There is no evidence of metallic foreign body within the orbits. No
significant bone abnormality identified.
IMPRESSION: No evidence of metallic foreign body within the orbits.

## 2021-01-01 DIAGNOSIS — R0609 Other forms of dyspnea: Secondary | ICD-10-CM | POA: Diagnosis not present

## 2021-01-01 DIAGNOSIS — E063 Autoimmune thyroiditis: Secondary | ICD-10-CM | POA: Diagnosis not present

## 2021-02-15 DIAGNOSIS — E0590 Thyrotoxicosis, unspecified without thyrotoxic crisis or storm: Secondary | ICD-10-CM | POA: Diagnosis not present

## 2021-02-22 DIAGNOSIS — E063 Autoimmune thyroiditis: Secondary | ICD-10-CM | POA: Diagnosis not present

## 2021-02-22 DIAGNOSIS — K7689 Other specified diseases of liver: Secondary | ICD-10-CM | POA: Diagnosis not present

## 2021-07-09 DIAGNOSIS — E039 Hypothyroidism, unspecified: Secondary | ICD-10-CM | POA: Diagnosis not present

## 2021-07-15 ENCOUNTER — Other Ambulatory Visit (HOSPITAL_COMMUNITY): Payer: Self-pay | Admitting: Family Medicine

## 2021-07-15 ENCOUNTER — Other Ambulatory Visit: Payer: Self-pay | Admitting: Family Medicine

## 2021-07-15 DIAGNOSIS — K7689 Other specified diseases of liver: Secondary | ICD-10-CM | POA: Diagnosis not present

## 2021-07-15 DIAGNOSIS — E063 Autoimmune thyroiditis: Secondary | ICD-10-CM | POA: Diagnosis not present

## 2021-07-15 DIAGNOSIS — E785 Hyperlipidemia, unspecified: Secondary | ICD-10-CM | POA: Diagnosis not present

## 2021-07-15 DIAGNOSIS — F411 Generalized anxiety disorder: Secondary | ICD-10-CM | POA: Diagnosis not present

## 2021-07-15 DIAGNOSIS — R1011 Right upper quadrant pain: Secondary | ICD-10-CM

## 2021-07-15 DIAGNOSIS — R945 Abnormal results of liver function studies: Secondary | ICD-10-CM

## 2021-07-23 ENCOUNTER — Ambulatory Visit (HOSPITAL_COMMUNITY)
Admission: RE | Admit: 2021-07-23 | Discharge: 2021-07-23 | Disposition: A | Discharge status: Home / Self Care | Payer: BC Managed Care – PPO | Source: Ambulatory Visit | Attending: Family Medicine | Admitting: Family Medicine

## 2021-07-23 ENCOUNTER — Other Ambulatory Visit: Payer: Self-pay

## 2021-07-23 DIAGNOSIS — M9902 Segmental and somatic dysfunction of thoracic region: Secondary | ICD-10-CM | POA: Diagnosis not present

## 2021-07-23 DIAGNOSIS — K7689 Other specified diseases of liver: Secondary | ICD-10-CM | POA: Diagnosis not present

## 2021-07-23 DIAGNOSIS — M9903 Segmental and somatic dysfunction of lumbar region: Secondary | ICD-10-CM | POA: Diagnosis not present

## 2021-07-23 DIAGNOSIS — R1011 Right upper quadrant pain: Secondary | ICD-10-CM | POA: Diagnosis not present

## 2021-07-23 DIAGNOSIS — R945 Abnormal results of liver function studies: Secondary | ICD-10-CM | POA: Diagnosis not present

## 2021-07-23 DIAGNOSIS — M62830 Muscle spasm of back: Secondary | ICD-10-CM | POA: Diagnosis not present

## 2021-07-23 DIAGNOSIS — M546 Pain in thoracic spine: Secondary | ICD-10-CM | POA: Diagnosis not present

## 2021-07-28 DIAGNOSIS — M9903 Segmental and somatic dysfunction of lumbar region: Secondary | ICD-10-CM | POA: Diagnosis not present

## 2021-07-28 DIAGNOSIS — M546 Pain in thoracic spine: Secondary | ICD-10-CM | POA: Diagnosis not present

## 2021-07-28 DIAGNOSIS — M9902 Segmental and somatic dysfunction of thoracic region: Secondary | ICD-10-CM | POA: Diagnosis not present

## 2021-07-28 DIAGNOSIS — M62830 Muscle spasm of back: Secondary | ICD-10-CM | POA: Diagnosis not present

## 2021-07-29 ENCOUNTER — Telehealth: Payer: Self-pay | Admitting: "Endocrinology

## 2021-07-29 DIAGNOSIS — K219 Gastro-esophageal reflux disease without esophagitis: Secondary | ICD-10-CM | POA: Diagnosis not present

## 2021-07-29 DIAGNOSIS — F411 Generalized anxiety disorder: Secondary | ICD-10-CM | POA: Diagnosis not present

## 2021-07-29 NOTE — Telephone Encounter (Signed)
Called pt - received new referral on pt from Dr Juel Burrow office. He was last seen in June of 2021. He just needs an office visit. Updated referral in folder. Patient did not have a VM set up

## 2021-08-04 ENCOUNTER — Ambulatory Visit: Payer: PRIVATE HEALTH INSURANCE | Admitting: "Endocrinology

## 2021-10-05 DIAGNOSIS — E349 Endocrine disorder, unspecified: Secondary | ICD-10-CM | POA: Diagnosis not present

## 2021-10-05 DIAGNOSIS — E063 Autoimmune thyroiditis: Secondary | ICD-10-CM | POA: Diagnosis not present

## 2021-10-05 DIAGNOSIS — E8881 Metabolic syndrome: Secondary | ICD-10-CM | POA: Diagnosis not present

## 2021-10-05 DIAGNOSIS — E079 Disorder of thyroid, unspecified: Secondary | ICD-10-CM | POA: Diagnosis not present

## 2021-10-05 DIAGNOSIS — R5383 Other fatigue: Secondary | ICD-10-CM | POA: Diagnosis not present

## 2021-10-05 DIAGNOSIS — E039 Hypothyroidism, unspecified: Secondary | ICD-10-CM | POA: Diagnosis not present

## 2021-10-05 DIAGNOSIS — M2550 Pain in unspecified joint: Secondary | ICD-10-CM | POA: Diagnosis not present

## 2021-10-05 DIAGNOSIS — M359 Systemic involvement of connective tissue, unspecified: Secondary | ICD-10-CM | POA: Diagnosis not present

## 2021-10-22 DIAGNOSIS — R350 Frequency of micturition: Secondary | ICD-10-CM | POA: Diagnosis not present

## 2021-10-22 DIAGNOSIS — G932 Benign intracranial hypertension: Secondary | ICD-10-CM | POA: Diagnosis not present

## 2021-10-22 DIAGNOSIS — N5089 Other specified disorders of the male genital organs: Secondary | ICD-10-CM | POA: Diagnosis not present

## 2021-12-10 DIAGNOSIS — E038 Other specified hypothyroidism: Secondary | ICD-10-CM | POA: Diagnosis not present

## 2021-12-10 DIAGNOSIS — E063 Autoimmune thyroiditis: Secondary | ICD-10-CM | POA: Diagnosis not present
